# Patient Record
Sex: Female | Born: 1945 | Race: White | Hispanic: No | Smoking: Never smoker
Health system: Southern US, Community
[De-identification: ages and names within clinical notes are randomized; demographics above are authoritative.]

## PROBLEM LIST (undated history)

## (undated) DIAGNOSIS — J45909 Unspecified asthma, uncomplicated: Secondary | ICD-10-CM

## (undated) DIAGNOSIS — G20 Parkinson's disease: Secondary | ICD-10-CM

## (undated) DIAGNOSIS — K7400 Hepatic fibrosis, unspecified: Secondary | ICD-10-CM

## (undated) DIAGNOSIS — E7800 Pure hypercholesterolemia, unspecified: Secondary | ICD-10-CM

## (undated) DIAGNOSIS — J449 Chronic obstructive pulmonary disease, unspecified: Secondary | ICD-10-CM

## (undated) DIAGNOSIS — I10 Essential (primary) hypertension: Secondary | ICD-10-CM

## (undated) DIAGNOSIS — K219 Gastro-esophageal reflux disease without esophagitis: Secondary | ICD-10-CM

## (undated) HISTORY — DX: Parkinson's disease: G20

## (undated) HISTORY — DX: Essential (primary) hypertension: I10

## (undated) HISTORY — DX: Pure hypercholesterolemia, unspecified: E78.00

## (undated) HISTORY — PX: ABDOMINAL HYSTERECTOMY: SHX81

## (undated) HISTORY — DX: Chronic obstructive pulmonary disease, unspecified: J44.9

## (undated) HISTORY — PX: TONSILLECTOMY: SUR1361

## (undated) HISTORY — DX: Gastro-esophageal reflux disease without esophagitis: K21.9

## (undated) HISTORY — PX: LEG SURGERY: SHX1003

## (undated) HISTORY — DX: Hepatic fibrosis: K74.0

## (undated) HISTORY — PX: WRIST SURGERY: SHX841

## (undated) HISTORY — DX: Hepatic fibrosis, unspecified: K74.00

## (undated) HISTORY — PX: LAPAROSCOPY: SHX197

## (undated) HISTORY — DX: Unspecified asthma, uncomplicated: J45.909

## (undated) HISTORY — PX: APPENDECTOMY: SHX54

---

## 1998-11-30 ENCOUNTER — Inpatient Hospital Stay (HOSPITAL_COMMUNITY): Admission: EM | Admit: 1998-11-30 | Discharge: 1998-12-02 | Payer: Self-pay | Admitting: Emergency Medicine

## 1998-11-30 ENCOUNTER — Encounter: Payer: Self-pay | Admitting: Cardiovascular Disease

## 1999-08-02 ENCOUNTER — Encounter: Payer: Self-pay | Admitting: Obstetrics and Gynecology

## 1999-08-10 ENCOUNTER — Inpatient Hospital Stay (HOSPITAL_COMMUNITY): Admission: RE | Admit: 1999-08-10 | Discharge: 1999-08-12 | Payer: Self-pay | Admitting: Obstetrics and Gynecology

## 1999-11-07 ENCOUNTER — Encounter: Payer: Self-pay | Admitting: Neurosurgery

## 1999-11-07 ENCOUNTER — Encounter: Admission: RE | Admit: 1999-11-07 | Discharge: 1999-11-07 | Payer: Self-pay | Admitting: Neurosurgery

## 1999-11-16 ENCOUNTER — Ambulatory Visit (HOSPITAL_COMMUNITY): Admission: RE | Admit: 1999-11-16 | Discharge: 1999-11-16 | Payer: Self-pay | Admitting: Neurosurgery

## 1999-11-16 ENCOUNTER — Encounter: Payer: Self-pay | Admitting: Neurosurgery

## 1999-11-20 ENCOUNTER — Encounter: Admission: RE | Admit: 1999-11-20 | Discharge: 2000-02-18 | Payer: Self-pay | Admitting: Orthopedic Surgery

## 1999-12-11 ENCOUNTER — Encounter: Payer: Self-pay | Admitting: Neurosurgery

## 1999-12-11 ENCOUNTER — Ambulatory Visit (HOSPITAL_COMMUNITY): Admission: RE | Admit: 1999-12-11 | Discharge: 1999-12-11 | Payer: Self-pay | Admitting: Neurosurgery

## 2001-05-09 ENCOUNTER — Ambulatory Visit (HOSPITAL_COMMUNITY): Admission: RE | Admit: 2001-05-09 | Discharge: 2001-05-09 | Payer: Self-pay | Admitting: Family Medicine

## 2001-05-09 ENCOUNTER — Encounter: Payer: Self-pay | Admitting: Family Medicine

## 2001-05-22 ENCOUNTER — Encounter: Payer: Self-pay | Admitting: Family Medicine

## 2001-05-22 ENCOUNTER — Ambulatory Visit (HOSPITAL_COMMUNITY): Admission: RE | Admit: 2001-05-22 | Discharge: 2001-05-22 | Payer: Self-pay | Admitting: Family Medicine

## 2014-12-02 ENCOUNTER — Encounter: Payer: Self-pay | Admitting: Neurology

## 2014-12-02 ENCOUNTER — Ambulatory Visit (INDEPENDENT_AMBULATORY_CARE_PROVIDER_SITE_OTHER): Payer: Medicare Other | Admitting: Neurology

## 2014-12-02 ENCOUNTER — Telehealth: Payer: Self-pay | Admitting: Neurology

## 2014-12-02 VITALS — BP 110/62 | HR 76 | Ht 64.5 in | Wt 180.0 lb

## 2014-12-02 DIAGNOSIS — H539 Unspecified visual disturbance: Secondary | ICD-10-CM

## 2014-12-02 DIAGNOSIS — G4752 REM sleep behavior disorder: Secondary | ICD-10-CM | POA: Diagnosis not present

## 2014-12-02 DIAGNOSIS — K5901 Slow transit constipation: Secondary | ICD-10-CM | POA: Diagnosis not present

## 2014-12-02 DIAGNOSIS — G20 Parkinson's disease: Secondary | ICD-10-CM | POA: Diagnosis not present

## 2014-12-02 MED ORDER — PRAMIPEXOLE DIHYDROCHLORIDE 0.5 MG PO TABS: 0.5000 mg | ORAL_TABLET | Freq: Three times a day (TID) | ORAL | Status: DC

## 2014-12-02 MED ORDER — PRAMIPEXOLE DIHYDROCHLORIDE 0.125 MG PO TABS: ORAL_TABLET | ORAL | Status: DC

## 2014-12-02 NOTE — Progress Notes (Signed)
Note routed to Dr Sunnie Nielsenhakosha Coleman at [phone].

## 2014-12-02 NOTE — Progress Notes (Signed)
Sandra Strickland was seen today in the movement disorders clinic for neurologic consultation at the request of Dr. Effie Shy (she goes to the doctor in [facility] because she is native [place] and it was free and she had no insurance for a while).  The patient has previously seen Dr. Craige Cotta, but those notes are not available.  The consultation is for the evaluation of Parkinson's disease.  The first symptom(s) the patient noticed was shuffling gait and this was 3 years ago and her mother told her to go to the doctor as her father had PD and had similar sx's.  She was then referred to Dr. Craige Cotta.  She had and MRI brain and she cannot remember the results but remembers it wasn't completely normal.  She was dx with PD.  She was placed on a new drug (? Azilect) and she had no insurance and so she was then placed on carbidopa/levodopa 25/100 and she started at 1 po tid and it helped; about 7 months ago, Dr. Craige Cotta increased her medication to 1.5 tid and it helped but she is having more tremor and more shuffling again.  Her biggest c/o is spasms of the hands and cramping of the feet, more at night but also during the daytime.  She will try mustard and vinegar to try and help.  Specific Symptoms:  Tremor: Yes.   (both hands and both legs - not sure that it was ever one sided) Voice: some hypophonic Sleep: trouble staying asleep  Vivid Dreams:  Yes.    Acting out dreams:  Yes.   (sleep walk 3 times, which was very unusual for her) Wet Pillows: No. Postural symptoms:  Yes.    Falls?  Yes.   (fell 2 times over last 2 years; attributes some to back pain and pinched nerve that comes down the left leg) Bradykinesia symptoms: shuffling gait, drooling while awake and difficulty getting out of a chair Loss of smell:  No. Loss of taste:  Yes.   Urinary Incontinence:  No. (better after bladder suspension) Difficulty Swallowing:  Yes.   (big pills usually; rice) Handwriting, micrographia: intermittently, has to consciously  think about it Trouble with ADL's:  No.., but some recent trouble with makeup  Trouble buttoning clothing: Yes.   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  visual distortions: No. N/V:  No. (not consistent but some intermittent) Lightheaded:  intermittent  Syncope: No. Diplopia:  No. Dyskinesia:  Unsure; ? If just having muscle spasms as seems to be moving because of back pain Constipation: no  Neuroimaging has previously been performed.  It is not available for my review today.    ALLERGIES:   Allergies  Allergen Reactions  . Prednisone Other (See Comments)    Elevated BP    CURRENT MEDICATIONS:  Outpatient Encounter Prescriptions as of 12/02/2014  Medication Sig  . albuterol (PROVENTIL) (2.5 MG/3ML) 0.083% nebulizer solution 2.5 mg.  . aspirin 81 MG tablet Take 81 mg by mouth daily.  Marland Kitchen atorvastatin (LIPITOR) 40 MG tablet Take 40 mg by mouth.  . Calcium Carbonate-Vitamin D (CALCIUM-VITAMIN D) 500-200 MG-UNIT per tablet Take 1 tablet by mouth.  . carbidopa-levodopa (SINEMET IR) 25-100 MG per tablet Take 1.5 tablets by mouth 3 (three) times daily.   Marland Kitchen  cyclobenzaprine (FLEXERIL) 10 MG tablet Take 10 mg by mouth.  . flunisolide (NASALIDE) 25 MCG/ACT (0.025%) SOLN Place 2 sprays into the nose 2 (two) times daily.  Marland Kitchen. glycopyrrolate (ROBINUL) 1 MG tablet Take 1 mg by mouth 2 (two) times daily.   Marland Kitchen. lisinopril (PRINIVIL,ZESTRIL) 10 MG tablet Take 10 mg by mouth.  . Magnesium Hydroxide 2400 MG/10ML SUSP Take by mouth.  . metoprolol tartrate (LOPRESSOR) 25 MG tablet Take 25 mg by mouth daily.  . montelukast (SINGULAIR) 10 MG tablet Take 10 mg by mouth.  Marland Kitchen. omeprazole (PRILOSEC) 20 MG capsule Take 20 mg by mouth.  . pramipexole (MIRAPEX) 0.125 MG tablet 1 tablet three times per  day for a week, then 2 tablets three times per day for a week and then fill the 0.5 mg tablet and take that  . pramipexole (MIRAPEX) 0.5 MG tablet Take 1 tablet (0.5 mg total) by mouth 3 (three) times daily.    PAST MEDICAL HISTORY:   Past Medical History  Diagnosis Date  . Parkinson's disease   . Hypercholesteremia   . Hypertension   . GERD (gastroesophageal reflux disease)   . Asthma   . COPD (chronic obstructive pulmonary disease)   . Fibrosis of liver     PAST SURGICAL HISTORY:   Past Surgical History  Procedure Laterality Date  . Tonsillectomy    . Appendectomy    . Abdominal hysterectomy    . Leg surgery Bilateral     2 left 4 right  . Laparoscopy    . Wrist surgery Left     SOCIAL HISTORY:   History   Social History  . Marital Status: Legally Separated    Spouse Name: N/A  . Number of Children: N/A  . Years of Education: N/A   Occupational History  . Not on file.   Social History Main Topics  . Smoking status: Never Smoker   . Smokeless tobacco: Not on file  . Alcohol Use: No  . Drug Use: No  . Sexual Activity: Not on file   Other Topics Concern  . Not on file   Social History Narrative  . No narrative on file    FAMILY HISTORY:   Family Status  Relation Status Death Age  . Mother Alive     heart disease  . Father Deceased     PD  . Brother Deceased     Liver, lung, esophageal cancer  . Sister Alive     HTN  . Son Alive     healthy  . Daughter Alive     healthy  . Daughter Alive     healthy    ROS:  Having issues with peripheral vision off to the right for years.  ]A complete 10 system review of systems was obtained and was unremarkable apart from what is mentioned above.  PHYSICAL EXAMINATION:    VITALS:   Filed Vitals:   12/02/14 0927  BP: 110/62  Pulse: 76  Height: 5' 4.5" (1.638 m)  Weight: 180 lb (81.647 kg)    GEN:  The patient appears stated age and is in NAD. HEENT:  Normocephalic, atraumatic.  The mucous membranes  are moist. The superficial temporal arteries are without ropiness or tenderness. CV:  RRR Lungs:  CTAB Neck/HEME:  There are no carotid bruits bilaterally.  Neurological examination:  Orientation: The patient is alert and oriented x3. Fund of knowledge is appropriate.  Recent and remote memory are intact.  Attention and concentration are normal.  Able to name objects and repeat phrases. Cranial nerves: There is good facial symmetry.  There is just mild facial hypomimia Pupils are equal round and reactive to light bilaterally. Fundoscopic exam reveals clear margins bilaterally.  There are no square wave jerks.  Extraocular muscles are intact. The visual fields are full to confrontational testing.  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  The speech is fluent and clear.  She is mildly hypophonic.  She has no difficulty with the guttural sounds.  Soft palate rises symmetrically and there is no tongue deviation. Hearing is intact to conversational tone. Sensation: Sensation is intact to light and pinprick throughout (facial, trunk, extremities). Vibration is intact at the bilateral big toe. There is no extinction with double simultaneous stimulation. There is no sensory dermatomal level identified. Motor: Strength is 5/5 in the bilateral upper and lower extremities.   Shoulder shrug is equal and symmetric.  There is no pronator drift. Deep tendon reflexes: Deep tendon reflexes are 2/4 at the bilateral biceps, triceps, brachioradialis, patella and 1/4 at the bilateral achilles. Plantar responses are downgoing bilaterally.  Movement examination: Tone: There is normal tone in the bilateral upper extremities.  The tone in the lower extremities is normal.  Abnormal movements: No tremor identified today, even with distraction procedures Coordination:  There is mild decremation with RAM's, seen most prominently with finger  taps bilaterally Gait and Station: The patient has no significant difficulty arising out of a deep-seated chair without the use of the hands. The patient's stride length is slightly decreased, but she also has an antalgic gait and drags the left leg.  She has some decreased arm swing on the right.  I tried to pull test several times and repeated instructions, but each time she grabbed onto the wall.    ASSESSMENT/PLAN:  1.  Parkinson's disease, by history  -She really did not look particularly parkinsonian today, but she had taken medication but not since 3:30 AM and she was in my office at approximately 10:30 AM.  I asked her to hold her medications next visit before she comes in.  She does have symptoms that sound consistent with Parkinson's disease, although she is very well right up on the disease.  She has a Parkinson's advocate as well as an is part of the [employer].  I spent much greater than 50% of the visit in counseling with her today.  She is having significant cramping, which could be because she is spacing the medication out so much.  She gets up at 3:30 AM because of her job and ends up taking her carbidopa levodopa at 3:30 AM/11:30 AM/5:30 PM and then doesn't go to bed until somewhere between 9 PM and 11 PM.  It was resistant to increasing her levodopa, primarily because of how good she looked today and because it does not sound like she has tried an agonist.  -We decided to go ahead and try Mirapex and hopefully this helps with the cramping that she is having.  She will work up to 0.5 g 3 times per day.  She does have insurance now.  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  The opportunity to ask questions was given and they were answered to the best of my  ability.  The patient expressed understanding and  willingness to follow the outlined treatment protocols.  -We talked about community resources.  She lives quite a ways away, but states she does not mind driving for LSVT/PWR trained providers.  Therefore, she will be referred to the neuro rehabilitation center.  We talked about the [facility] biking program.  We talked about the importance of safe, cardiovascular exercise.  We talked about the upcoming Yisroel Ramming challenge and she was given patient education/information on this.  Talked about the mPower app. 2.  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  I was unable to reproduce this on examination.  She states that she has had an MRI.  I would like to try to get a copy of that.  I would like to refer her to ophthalmology.  She did not think she could do that, as did not think Medicare would pay.  I think that they would and I will try to get her a referral. 3.  Constipation  -A copy of the rancho recipe was given. 4.  REM behavior disorder  -I did not want to change too many things at once.  She is only had a few episodes, so we will keep an eye on this and consider clonazepam in the future. 5.  I will try to get a copy of her prior neurologist's records, Dr. Cra[facility].  Follow-up with me will be in the next few months, sooner should new neurologic issues arise.

## 2014-12-02 NOTE — Telephone Encounter (Signed)
[facility] with notes faxed to [phone] with confirmation received.

## 2014-12-02 NOTE — Patient Instructions (Addendum)
1. Constipation and Parkinson's disease:   1.Rancho recipe for constipation in Parkinsons Disease:  -1 cup of bran, 2 cups of applesauce in 1 cup of prune juice  2.  Increase fiber intake (Metamucil,vegetables)  3.  Regular, moderate exercise can be beneficial.  4.  Avoid medications causing constipation, such as medications like antacids with calcium or magnesium  5.  Laxative overuse should be avoided.  6.  Stool softeners (Colace) can help with chronic constipation. 2. Start mirapex (pramipexole) as follows:  0.125 mg - 1 tablet three times per day for a week, then 2 tablets three times per day for a week and then fill the 0.5 mg tablet and take that, 1 pill three times per day 3. MPower is the app that records results for Parkinson's Disease research.  4. [employer] Challenge: 11th annual event, Saturday April 23rd, 2016.   25 mile bike ride:    adventurous moderate distance for intermediate riders   50 mile bike ride:   challenging and scenic ride to [place],  plenty of climbing for experienced riders!   10K run:  safe route through a local neighborhood   5K run/walk:  safe route through a local neighborhood  [employer] scenery... Marked routes... Food... Drink... Great rest stop... Sag wagon support!   No "Entry Fee", donations expected & appreciated 100% of all donations will go to PD/PSP research, and Local Families faced with these challenges. Check In Time Opens: 7:30 a.m. Start Times: 50 mile ride 8:30 a.m.  25 mile ride 9:30 a.m.  5K & 10K 10 a.m. Helmets required All participants must sign a waiver of liability on the day of the  event at registration. Cookout following the event If you can't join us please consider a tax deductible donation  Make checks payable to: [employer]                                      [id number]                                     [place], [place] [id number]  5. We have scheduled [facility] at [facility] for your  eye exam on 12/17/2014 at 2:45 pm. Please arrive 15 minutes prior and go to [address] [place] [id number]. If you need to reschedule for any reason please call [phone]. 6. You have been referred to Neuro Rehab. They will call you directly to schedule an appointment.  Please call [phone] if you do not hear from them.  7. Next appt stay off Parkinson's medications on the day of appt. Follow up in 3 months.

## 2014-12-06 ENCOUNTER — Telehealth: Payer: Self-pay | Admitting: Neurology

## 2014-12-06 NOTE — Telephone Encounter (Signed)
Called patient back and she has the number. She is not able to do the bike program at [facility] but she is buying a stationary bike to use at home and wanted to let Dr Tat know.

## 2014-12-06 NOTE — Telephone Encounter (Signed)
Pt needs the telephone number to the where she is to have speech done at please call [phone]

## 2014-12-29 ENCOUNTER — Ambulatory Visit: Payer: Medicare Other | Admitting: Physical Therapy

## 2014-12-29 ENCOUNTER — Ambulatory Visit: Payer: Medicare Other | Admitting: Occupational Therapy

## 2014-12-29 ENCOUNTER — Ambulatory Visit: Payer: Medicare Other | Attending: Neurology

## 2014-12-29 DIAGNOSIS — R4189 Other symptoms and signs involving cognitive functions and awareness: Secondary | ICD-10-CM | POA: Insufficient documentation

## 2014-12-29 DIAGNOSIS — M6281 Muscle weakness (generalized): Secondary | ICD-10-CM | POA: Insufficient documentation

## 2014-12-29 DIAGNOSIS — R269 Unspecified abnormalities of gait and mobility: Secondary | ICD-10-CM | POA: Diagnosis not present

## 2014-12-29 DIAGNOSIS — R490 Dysphonia: Secondary | ICD-10-CM | POA: Insufficient documentation

## 2014-12-29 DIAGNOSIS — R258 Other abnormal involuntary movements: Secondary | ICD-10-CM

## 2014-12-29 DIAGNOSIS — R279 Unspecified lack of coordination: Secondary | ICD-10-CM | POA: Diagnosis not present

## 2014-12-29 NOTE — Patient Instructions (Signed)
Read out loud for approx 5 minutes and use your stronger talking

## 2014-12-29 NOTE — Therapy (Signed)
[facility] [address] [place], [place], [address] Phone: [phone]   Fax:  [phone]  Occupational Therapy Evaluation  Patient Details  Name: Sandra Strickland MRN: [id number] Date of Birth: 1946-02-20 Referring Provider:  No ref. provider found  Encounter Date: 12/29/2014      OT End of Session - 12/29/14 0858    Visit Number 1   Number of Visits 17   Date for OT Re-Evaluation 02/26/15   Authorization Type Medicare   Authorization - Visit Number 1   Authorization - Number of Visits 10   OT Start Time [phone]   OT Stop Time 0930   OT Time Calculation (min) 39 min   Activity Tolerance Patient tolerated treatment well   Behavior During Therapy [facility] for tasks assessed/performed      Past Medical History  Diagnosis Date  . Parkinson's disease   . Hypercholesteremia   . Hypertension   . GERD (gastroesophageal reflux disease)   . Asthma   . COPD (chronic obstructive pulmonary disease)   . Fibrosis of liver     Past Surgical History  Procedure Laterality Date  . Tonsillectomy    . Appendectomy    . Abdominal hysterectomy    . Leg surgery Bilateral     2 left 4 right  . Laparoscopy    . Wrist surgery Left     There were no vitals filed for this visit.  Visit Diagnosis:  Lack of coordination  Bradykinesia  Cognitive deficits      Subjective Assessment - 12/29/14 0855    Subjective  Pt reports she was diagnosed with PD approximately 3 years ago and has had a decline in status.   Pertinent History CVA , ? homonymous hemianopsia per MD notes, PD   Patient Stated Goals HEP, improve handwriting, ADLS   Currently in Pain? Yes   Pain Score 10-Worst pain ever   Pain Location Wrist   Pain Orientation Right   Pain Descriptors / Indicators Aching   Pain Type Chronic pain   Pain Onset More than a month ago   Pain Frequency Intermittent   Aggravating Factors  weather, arthritis   Pain Relieving Factors  meds, advil   Multiple Pain Sites No           [facility] OT Assessment - 12/29/14 0001    Assessment   Diagnosis Parkinson's disease   Onset Date --  diagnosed approximately 2013   Assessment Pt with Parkinson's disease reports recent decline in status and she changed physician's to Dr. Arbutus Leas.   Prior Therapy PT   Precautions   Precautions Fall   Precaution Comments Pt reported visual deficit(? R homonymous hemianopsia)  from old CVA  Pt sees opthamologist today   Balance Screen   Has the patient fallen in the past 6 months Yes   How many times? 2   Has the patient had a decrease in activity level because of a fear of falling?  No   Is the patient reluctant to leave their home because of a fear of falling?  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   Mobility   Mobility Status Independent   Written Expression   Dominant Hand Right   Handwriting 100% legible;Mild micrographia  trails off at end   Vision - History   Visual History Other (comment)   Patient Visual Report Peripheral vision impairment   Additional Comments --  wears reading glasses only.   Vision Assessment    Vision Assessment Vision impaired  _ to be further tested in functional context   Comment Pt is seeing opthamologist regarding vision today, therapist requests copy of report.   Cognition   Memory Impaired  per pt report    Observation/Other Assessments   Standing Functional Reach Test RUE 8.5 in, LUE 9 in with LOB   Sensation   Light Touch Appears Intact   Coordination   Gross Motor Movements are Fluid and Coordinated Yes   Fine Motor Movements are Fluid and Coordinated No   9 Hole Peg Test Right;Left   Right 9 Hole Peg Test 21.75   Left 9 Hole Peg Test 23.59    Tremors bilateral UE's with fatigue per pt report, not observed   Coordination mildy decreased   ROM / Strength   AROM / PROM / Strength AROM   AROM   Overall AROM  Deficits   Overall AROM Comments grossly WFLS except for decreased bilateral wrist extension                           OT Short Term Goals - 12/29/14 1131    OT SHORT TERM GOAL #1   Title I with initial HEP.   Baseline check 01/27/15   Time 4   Period Weeks   Status New   OT SHORT TERM GOAL #2   Title Pt will verbalize understanding of adapted strategies for ADLS/IADLS.   Time 4   Period Weeks   Status New   OT SHORT TERM GOAL #3   Title Pt will demonstrate increased ease with feeding as evidenced by performing PPT#2 in 10 secs or less.   Time 4   Period Weeks   Status New   OT SHORT TERM GOAL #4   Title Pt will demonstrate ability to write short paragraph with 100% legibility and no significant decrease in letter size.   Time 4   Period Weeks   Status New           OT Long Term Goals - 12/29/14 1133    OT LONG TERM GOAL #1   Title Pt will verbalize understanding of ways to prevent future PD- releated complications and verbalize understanding of community resources.   Baseline 02/26/15   Time 8   Period Weeks   Status New   OT LONG TERM GOAL #2   Title Pt will demontrate improved functional reaching as evidenced by  improving bilateral standing functional reach to 10 inches or greater bilaterally, without LOB   Baseline baseline: R: 8.5, L 9 inches with 1 LOB   Time 8   Period Weeks   Status New   OT LONG TERM GOAL #3  Title Pt will verbalize understanding of compensatory strategies for cognitive deficits and ways to keep thinking skills sharp.   Time 8   Period Weeks   Status New   OT LONG TERM GOAL #4   Title Pt will verbalize understanding of compensatory strategies for visual deficits.   Time 8   Period Weeks   Status New               Plan - 12/29/14 1128    Clinical Impression Statement Pt diagnosed with PD several years ago, with history of CVA presents with mildly decreased coordination, bradykinesia, and cognitive deficits which impedes ADLs/ IADLS.   Pt will benefit from skilled therapeutic intervention in order to improve on the following deficits (Retired) Decreased cognition;Impaired flexibility;Pain;Impaired vision/preception;Impaired sensation;Decreased coordination;Decreased activity tolerance;Decreased endurance;Decreased range of motion;Decreased strength;Impaired UE functional use;Impaired perceived functional ability;Difficulty walking;Decreased balance   Rehab Potential Good   Clinical Impairments Affecting Rehab Potential see above   OT Frequency 1x / week  plus eval, per pt request   OT Duration 8 weeks   Plan PWR! moves HEP, handwriting strategies   Consulted and Agree with Plan of Care Patient          G-Codes - 12/29/14 1254    Functional Assessment Tool Used standing functional reach RUE 8.5 inches, LUE 9 inches with LOB, PPT #2: 12.0 secs, mild micrographia,    Functional Limitation Self care   Self Care Current Status ([id number]) At least 20 percent but less than 40 percent impaired, limited or restricted   Self Care Goal Status (U0454(G8988) At least 1 percent but less than 20 percent impaired, limited or restricted      Problem List There are no active  problems to display for this patient.   RINE,KATHRYN 12/29/2014, 1:00 PM Keene BreathKathryn Rine, OTR/L [phone] Phone: [phone] 1:00 PM 12/29/2014 [facility] [address] [place], [place], [address] Phone: [phone]   Fax:  [phone]

## 2014-12-29 NOTE — Therapy (Addendum)
[facility] [address] [place], [place], [address] Phone: [phone]   Fax:  [phone]  Speech Language Pathology Evaluation  Patient Details  Name: Sandra Strickland MRN: [id number] Date of Birth: 1945/11/13 Referring Provider:  No ref. provider found  Encounter Date: 12/29/2014      End of Session - 12/29/14 1334    Visit Number 1   Number of Visits 17   Date for SLP Re-Evaluation 02/27/15   SLP Start Time 1019   SLP Stop Time  1101   SLP Time Calculation (min) 42 min   Activity Tolerance Patient tolerated treatment well      Past Medical History  Diagnosis Date  . Parkinson's disease   . Hypercholesteremia   . Hypertension   . GERD (gastroesophageal reflux disease)   . Asthma   . COPD (chronic obstructive pulmonary disease)   . Fibrosis of liver     Past Surgical History  Procedure Laterality Date  . Tonsillectomy    . Appendectomy    . Abdominal hysterectomy    . Leg surgery Bilateral     2 left 4 right  . Laparoscopy    . Wrist surgery Left     There were no vitals filed for this visit.  Visit Diagnosis: Hypokinetic Parkinsonian dysphonia      Subjective Assessment - 12/29/14 1035    Subjective "I saw what my dad went through - he had Parkinson's - and I try to talk louder because he lost his voice."            SLP Evaluation [facility] - 12/29/14 1036    Pain Assessment   Currently in Pain? Yes   Pain Score 10-Worst pain ever   Pain Location Wrist   Pain Orientation Right   Pain Type Chronic pain   Pain Onset More than a month ago   Pain Frequency Intermittent   Pain Relieving Factors meds      7 minutes of conversational speech was reduced today, at average 68dB (WNL= average 70-72dB) with range of 62-71dB, when a sound level meter was placed 30 cm away from pt's mouth. Overall intelligibility for this listener in a quiet environment was approx 95-100%. Production of loud /a/  averaged 84dB (range of 79 to 85dB) and rare verbal cues needed for loudness. Pt rated effort level at 6/10 for production of loud /a/ (10=maximal effort).   Oral motor assessment revealed incoordinated lingual ROM and WFL lingual strength. Labial ROM was incoordinated and strength was [facility]. Velar ROM appeared WFL/WNL.   After evaluation tasks, pt was asked to use the same amount of effort as with loud /a/ in a 6 minute conversation task. SLP assessed pt's success during this conversation at remaining within the expected loudness range of 70-72dB. Loudness average with this increased effort was 71dB (range of 66 to 74) with occasional SLP verbal A for loudness. Pt would benefit from skilled ST in order to improve speech intelligibility and pt's QOL.          SLP Education - 12/29/14 1055    Education provided Yes   Education Details loud /a/ (shaping and need for daily), need for louder speech   Person(s) Educated Patient   Methods Explanation   Comprehension Verbalized understanding;Returned demonstration;Verbal cues required          SLP Short Term Goals - 12/29/14 1337    SLP SHORT TERM GOAL #1   Title pt will maintain loud /a/ of  85dB over 3 sessions   Time 4   Period Weeks   Status New   SLP SHORT TERM GOAL #2   Title pt will tell sentence responses with average 71dB over two sessions   Time 4   Period Weeks   Status New   SLP SHORT TERM GOAL #3   Title pt will maintain average 71dB over two sessions in 5 minute conversation   Time 4   Period Weeks   Status New          SLP Long Term Goals - 12/29/14 1340    SLP LONG TERM GOAL #1   Title pt will maintain 85dB over 5 sessions with loud /a/   Time 8   Period Weeks   Status New   SLP LONG TERM GOAL #2   Title pt will participate in 10 minutes mod complex conversation with average 71dB   Time 8   Period Weeks   Status New          Plan - 12/29/14 1335    Clinical Impression Statement Pt presents with mild  dysarthria today characterized by decr'd breath support.   Speech Therapy Frequency 1x /week  due to travel distance   Duration --  8 weeks   Treatment/Interventions Compensatory strategies;SLP instruction and feedback;Patient/family education;Functional tasks;Cueing hierarchy   Potential to Achieve Goals Good          G-Codes - 12/29/14 1342    Functional Assessment Tool Used noms, clincial observation   Functional Limitations Motor speech   Motor Speech Current Status [phone]([id number]) At least 1 percent but less than 20 percent impaired, limited or restricted   Motor Speech Goal Status ([id number](G9186) 0 percent impaired, limited or restricted      Problem List There are no active problems to display for this patient.   [facility] Center[facility]INKE,Tregan Read , MS, CCC-SLP  12/29/2014, 1:42 PM  [place] [facility] [address] [place], [place], [address] Phone: [phone]   Fax:  [phone]   ADDENDUM:  01-04-15 1652 Pt called and cancelled all appointments due to not being able to get off of work to make appointments. Therefore pt will not be receiving ST.  Verdie Mosherarl Brianca Fortenberry, MS, CCC-SLP

## 2014-12-29 NOTE — Therapy (Signed)
[facility] [address] [place], [place], [address] Phone: [phone]   Fax:  [phone]  Physical Therapy Evaluation  Patient Details  Name: Sandra Strickland MRN: [id number] Date of Birth: 1946-06-23 Referring Provider:  No ref. provider found  Encounter Date: 12/29/2014      PT End of Session - 12/29/14 1813    Visit Number 1   Number of Visits 9   Date for PT Re-Evaluation 02/27/15   Authorization Type Medicare; requires G-code every 10th visit   PT Start Time 0935   PT Stop Time 1015   PT Time Calculation (min) 40 min   Activity Tolerance Patient tolerated treatment well   Behavior During Therapy [facility] for tasks assessed/performed      Past Medical History  Diagnosis Date  . Parkinson's disease   . Hypercholesteremia   . Hypertension   . GERD (gastroesophageal reflux disease)   . Asthma   . COPD (chronic obstructive pulmonary disease)   . Fibrosis of liver     Past Surgical History  Procedure Laterality Date  . Tonsillectomy    . Appendectomy    . Abdominal hysterectomy    . Leg surgery Bilateral     2 left 4 right  . Laparoscopy    . Wrist surgery Left     There were no vitals filed for this visit.  Visit Diagnosis:  Bradykinesia  Abnormality of gait  Muscle weakness of lower extremity      Subjective Assessment - 12/29/14 0938    Subjective Pt is a 69 year old female who presents to OP PT with Parkinson's disease.  She reports diagnosis was about 3 years ago.  She feels medications help with the tremors.  She notices her feet are shuffling with gait, more so than usual.  Pt has had 3 falls.  2 falls have occurred on ice; most recent fall was in the bathroom, where she just went down.   Pertinent History History of fibromyalgia, asthma   Patient Stated Goals Pt's goal for therapy is to be able to move easier.   Currently in Pain? Yes   Pain Score 10-Worst pain ever   Pain Location  Wrist  OT has documented wrist pain   Pain Orientation Right   Pain Descriptors / Indicators Aching   Pain Type Chronic pain   Pain Onset More than a month ago   Pain Frequency Intermittent   Aggravating Factors  weather   Pain Relieving Factors meds, advil            [facility] PT Assessment - 12/29/14 0945    Assessment   Medical Diagnosis Parkinson's disease   Precautions   Precautions Fall   Balance Screen   Has the patient fallen in the past 6 months Yes   How many times? 3   Has the patient had a decrease in activity level because of a fear of falling?  Yes   Is the patient reluctant to leave their home because of a fear of falling?  No   Home Environment   Living Enviornment Private residence   Living Arrangements Spouse/significant other   Available Help at Discharge Family  Husband   Type of Home House   Home Access Stairs to enter  platforms on deck to enter back of home; 1 step at a time   Entrance Stairs-Number of Steps 1   Home Layout One level;Able to live on main level with bedroom/bathroom   Home Equipment  Gilmer MorCane - single point   Prior Function   Vocation Full time employment   Vocation Requirements bus driver [employer]   Observation/Other Assessments   Focus on Therapeutic Outcomes (FOTO)  Functional Status Intake score 54; neuro QOL 36.9   Posture/Postural Control   Posture Comments Pt stands with RLE flexed at knee due to LLE being shorter than RLE.  Pt reports trying various height lifts in the past, with no improvement in gait.   ROM / Strength   AROM / PROM / Strength Strength   Strength   Strength Assessment Site Hip;Knee;Ankle   Right/Left Hip Right;Left   Right Hip Flexion 3+/5   Left Hip Flexion 4/5   Right/Left Knee Right;Left   Right Knee Flexion 4/5   Right Knee Extension 3+/5   Left Knee Flexion 4/5   Left Knee Extension 3+/5   Right/Left Ankle Right;Left   Right Ankle Dorsiflexion 4/5   Left Ankle Dorsiflexion 4/5    Transfers   Transfers Sit to Stand;Stand to Sit   Sit to Stand 6: Modified independent (Device/Increase time);Without upper extremity assist;From chair/3-in-1;Five times sit to stand  5x sit<>stand:  10.98 sec   Stand to Sit 6: Modified independent (Device/Increase time);Without upper extremity assist;To chair/3-in-1  Reports difficulty/help needed from lower surfaces   Ambulation/Gait   Ambulation/Gait Yes   Ambulation/Gait Assistance 6: Modified independent (Device/Increase time)   Ambulation Distance (Feet) 200 Feet   Assistive device Straight cane   Gait Pattern Step-through pattern;Decreased step length - left;Decreased stance time - left;Decreased step length - right;Antalgic;Poor foot clearance - left;Poor foot clearance - right  lateral lean due to LLE shorter than RLE   Ambulation Surface Level;Indoor   Gait velocity 15.86=2.07 ft/sec   Stairs Yes   Stairs Assistance 6: Modified independent (Device/Increase time)   Stair Management Technique Two rails;Alternating pattern   Number of Stairs 4   Height of Stairs 6   Standardized Balance Assessment   Standardized Balance Assessment Timed Up and Go Test;Four Square Step Test   Timed Up and Go Test   TUG Normal TUG;Manual TUG;Cognitive TUG   Normal TUG (seconds) 14.47   Manual TUG (seconds) 14.25   Cognitive TUG (seconds) 17.9   Functional Gait  Assessment   Gait assessed  Yes   Gait Level Surface Walks 20 ft, slow speed, abnormal gait pattern, evidence for imbalance or deviates 10-15 in outside of the 12 in walkway width. Requires more than 7 sec to ambulate 20 ft.  10.38 sec   Change in Gait Speed Able to change speed, demonstrates mild gait deviations, deviates 6-10 in outside of the 12 in walkway width, or no gait deviations, unable to achieve a major change in velocity, or uses a change in velocity, or uses an assistive device.   Gait with Horizontal Head Turns Performs head turns with moderate changes in gait velocity, slows  down, deviates 10-15 in outside 12 in walkway width but recovers, can continue to walk.   Gait with Vertical Head Turns Performs task with moderate change in gait velocity, slows down, deviates 10-15 in outside 12 in walkway width but recovers, can continue to walk.   Gait and Pivot Turn Turns slowly, requires verbal cueing, or requires several small steps to catch balance following turn and stop  extra steps to get balance with turn   Step Over Obstacle Is able to step over one shoe box (4.5 in total height) but must slow down and adjust steps to clear box safely.  May require verbal cueing.   Gait with Narrow Base of Support Ambulates less than 4 steps heel to toe or cannot perform without assistance.   Gait with Eyes Closed Cannot walk 20 ft without assistance, severe gait deviations or imbalance, deviates greater than 15 in outside 12 in walkway width or will not attempt task.   Ambulating Backwards Walks 20 ft, slow speed, abnormal gait pattern, evidence for imbalance, deviates 10-15 in outside 12 in walkway width.  15.60 sec in 10 ft   Steps Alternating feet, must use rail.   Total Score 10                             PT Short Term Goals - 12/29/14 1817    PT SHORT TERM GOAL #1   Title Pt will be independent with HEP for improved strength, balance and gait. (Target date 01/28/15)   Time 4   Period Weeks   Status New   PT SHORT TERM GOAL #2   Title Pt will perform at least 8 of 10 reps of sit<>stand transfers from surfaces <18 inches, without UE support, independently, for improved efficiency and safety with transfers.   Time 4   Period Weeks   Status New   PT SHORT TERM GOAL #3   Title Pt will improve TUG score to less than or equal to 13.5 seconds for decreased fall risk.   Baseline TUG 14.47 at eval   Time 4   Period Weeks   Status New   PT SHORT TERM GOAL #4   Title Pt will improve Functional Gait Assessment to at least 15/30 for decreased fall risk.    Baseline FGA 10/30 at eval   Time 4   Period Weeks   Status New           PT Long Term Goals - 12/29/14 1819    PT LONG TERM GOAL #1   Title Pt will verbalize understanding of fall prevention within the home environment.  Target 02/27/15   Time 8   Period Weeks   Status New   PT LONG TERM GOAL #2   Title Pt will improve gait velocity to at least 2.62 ft/sec for improved gait efficiency and safety.   Baseline gait velocity 2.07 ft/sec   Time 8   Period Weeks   Status New   PT LONG TERM GOAL #3   Title Pt will improve TUG cognitive score to less than or equal to 15 seconds for decreased fall risk and improved dual tasking.   Time 8   Period Weeks   Status New   PT LONG TERM GOAL #4   Title Pt will improve Functional Gait assessment score to at least 20/30 for decreased fall risk.   Time 8   Period Weeks   Status New   PT LONG TERM GOAL #5   Title Pt will verbalize plans for continued community fitness upon D/C from PT.   Time 8   Period Weeks   Status New               Plan - 12/29/14 1813    Clinical Impression Statement Pt is a 69 year old female who presents to OP PT with Parkinson's disease diagnosed about 3 years ago.  She feels she is noticing increased shuffling with gait and had had 3 falls in the past 6-9 months.  She is at fall risk per TUG and Functional Gait  Assessment scores; she has decreased gait speed of 2.07 ft/sec.  Pt presents with bradykinesia, decreased timing and coordination of gait, decreased functional strength.  Pt would benefit from PT to address balance, strength, gait for overall improved functional mobility and decreased fall risk.   Pt will benefit from skilled therapeutic intervention in order to improve on the following deficits Abnormal gait;Decreased balance;Decreased mobility;Difficulty walking;Decreased strength   Rehab Potential Good   PT Frequency 1x / week   PT Duration 8 weeks  plus eval   PT Treatment/Interventions ADLs/Self  Care Home Management;Therapeutic activities;Functional mobility training;Gait training;Therapeutic exercise;Balance training;Neuromuscular re-education;Cognitive remediation;Patient/family education   PT Next Visit Plan Initiate PWR! Moves HEP in sitting and standing (coordinate with OT); sit<>stand transfers from lower surfaces, gait activities   Consulted and Agree with Plan of Care Patient          G-Codes - January 14, 2015 1822    Functional Assessment Tool Used Timed Up and Go:  14.47 sec, TUG cognitive 17.9 sec, gait velocity 2.07 ft/sec; FGA 10/30   Functional Limitation Mobility: Walking and moving around   Mobility: Walking and Moving Around Current Status [phone]) At least 20 percent but less than 40 percent impaired, limited or restricted   Mobility: Walking and Moving Around Goal Status [phone]) At least 1 percent but less than 20 percent impaired, limited or restricted       Problem List There are no active problems to display for this patient.   Alcide Memoli W. 2015/01/14, 6:23 PM  Gean Maidens., PT [facility] [address] [place], [place], [address] Phone: [phone]   Fax:  [phone]

## 2015-01-04 NOTE — Therapy (Signed)
[facility] [address] [place], [place], [address] Phone: [phone]   Fax:  [phone]  Patient Details  Name: Sandra Strickland MRN: [id number] Date of Birth: [birth year] Referring Provider:  No ref. provider found  Encounter Date: 01/04/2015 Pt called to cancel all appointments due to not being able to get off of work.  Discharge at this time from skilled ST. Pt would benefit from therapy in future when her schedule better allows.  [facility]KE,Bakari Nikolai , MS, CCC-SLP  01/04/2015, 4:53 PM  [place] [facility] [address] [place], [place], [address] Phone: [phone]   Fax:  [phone]

## 2015-01-05 ENCOUNTER — Ambulatory Visit: Payer: Medicare Other

## 2015-01-12 ENCOUNTER — Encounter: Payer: PRIVATE HEALTH INSURANCE | Admitting: Occupational Therapy

## 2015-01-19 ENCOUNTER — Encounter: Payer: PRIVATE HEALTH INSURANCE | Admitting: Occupational Therapy

## 2015-01-26 ENCOUNTER — Encounter: Payer: PRIVATE HEALTH INSURANCE | Admitting: Occupational Therapy

## 2015-01-26 ENCOUNTER — Ambulatory Visit: Payer: PRIVATE HEALTH INSURANCE | Admitting: Physical Therapy

## 2015-02-04 ENCOUNTER — Telehealth: Payer: Self-pay | Admitting: Neurology

## 2015-02-04 NOTE — Telephone Encounter (Signed)
See below, please call pt / Sandra S.

## 2015-02-04 NOTE — Telephone Encounter (Signed)
Pt called and said that Dr Tat wanted her to go off her Parkinson's medication but she had another spell and would like a call back [phone]/Dawn

## 2015-02-04 NOTE — Telephone Encounter (Signed)
Unable to speak with patient [facility]

## 2015-02-08 ENCOUNTER — Telehealth: Payer: Self-pay | Admitting: Neurology

## 2015-02-08 NOTE — Telephone Encounter (Signed)
Tried to call patient back no answer and no way to leave message.

## 2015-02-08 NOTE — Telephone Encounter (Signed)
Pt called and would like a call back because she said that Dr Tat did not want her to take her meds on the day of her appointment but she said she has spells when not on it and did not if she should drive or not/Dawn WU#[phone]#[phone]

## 2015-02-09 NOTE — Telephone Encounter (Signed)
Patient made aware she should have a driver.

## 2015-03-02 ENCOUNTER — Encounter: Payer: Self-pay | Admitting: Neurology

## 2015-03-02 ENCOUNTER — Ambulatory Visit (INDEPENDENT_AMBULATORY_CARE_PROVIDER_SITE_OTHER): Payer: Medicare Other | Admitting: Neurology

## 2015-03-02 VITALS — BP 120/76 | HR 64 | Ht 64.0 in | Wt 180.0 lb

## 2015-03-02 DIAGNOSIS — G20A1 Parkinson's disease without dyskinesia, without mention of fluctuations: Secondary | ICD-10-CM

## 2015-03-02 DIAGNOSIS — G4752 REM sleep behavior disorder: Secondary | ICD-10-CM | POA: Diagnosis not present

## 2015-03-02 DIAGNOSIS — G20 Parkinson's disease: Secondary | ICD-10-CM | POA: Diagnosis not present

## 2015-03-02 DIAGNOSIS — R251 Tremor, unspecified: Secondary | ICD-10-CM

## 2015-03-02 NOTE — Progress Notes (Signed)
Sandra Strickland was seen today in the movement disorders clinic for neurologic consultation at the request of Dr. Effie Shy (she goes to the doctor in [facility] because she is native [place] and it was free and she had no insurance for a while).  The patient has previously seen Dr. Craige Cotta, but those notes are not available.  The consultation is for the evaluation of Parkinson's disease.  The first symptom(s) the patient noticed was shuffling gait and this was 3 years ago and her mother told her to go to the doctor as her father had PD and had similar sx's.  She was then referred to Dr. Craige Cotta.  She had and MRI brain and she cannot remember the results but remembers it wasn't completely normal.  She was dx with PD.  She was placed on a new drug (? Azilect) and she had no insurance and so she was then placed on carbidopa/levodopa 25/100 and she started at 1 po tid and it helped; about 7 months ago, Dr. Craige Cotta increased her medication to 1.5 tid and it helped but she is having more tremor and more shuffling again.  Her biggest c/o is spasms of the hands and cramping of the feet, more at night but also during the daytime.  She will try mustard and vinegar to try and help.  03/02/15 update:  The patient is following up today.  Last visit, I added pramipexole, 0.5 mg 3 times a day because of significant cramping.  She also takes carbidopa/levodopa 25/100, 1.5 tablet at 3:30 AM, 1.5 tablet at 11:30 AM and 1.5 tablet at 5:30 PM.  I want her to hold her medication for today's visit as last visit she did not look particularly parkinsonian and wanted to see how she looked today off of medication.  She has been off since 5 pm yesterday.  Since our last visit, she tried to attend therapy at the neuro-rehabilitation center but states that they couldn't do it all in one day and couldn't accomodate her work schedule.  She bought a stationary bike and a punching bag and boxing and feels so much better.  She is swimming for exercise  as well and her husband has noted a big difference in her physicially.  She feels much better.  She is averaging 12,000 steps a day.  The patient did see Dr. Nelle Don at [facility] ophthalmology because of vision change.  He did state that the patient had evidence of early cataracts with a history of stroke and perhaps some visual field loss but he needed to repeat the visual field testing in the future because she did not do well with initial testing.  There were a lot of false negatives.  In the right eye, there appeared to be a nasal defect but the left eye was nonspecific; there did appear to be visual field loss but it was nondiagnostic.  I was able to get records from her prior neurologist at [facility] since last visit.  It looks like they first saw the patient on 06/10/2013 and there was evidence of cogwheel rigidity on the left and it was suggested that the patient start Mirapex.  However, it turns out that this was too expensive for the patient at the time because she did not have insurance.  She was therefore started on levodopa, which did help somewhat.  This was increased in September, 2013-08-13/2 tablets 3 times per day.  An MRI of the brain was performed on 06/15/2013.  This disc did come  for my review and I did review it.  There was significant movement artifact present.  There were a few scattered T2 hyperintensities.  Neuroimaging has previously been performed.  It is not available for my review today.    ALLERGIES:   Allergies  Allergen Reactions  . Prednisone Other (See Comments)    Elevated BP    CURRENT MEDICATIONS:  Outpatient Encounter Prescriptions as of 03/02/2015  Medication Sig  . albuterol (PROVENTIL) (2.5 MG/3ML) 0.083% nebulizer solution 2.5 mg.  . aspirin 81 MG tablet Take 81 mg by mouth daily.  Marland Kitchen atorvastatin (LIPITOR) 40 MG tablet Take 40 mg by mouth.  . Calcium Carbonate-Vitamin D (CALCIUM-VITAMIN D) 500-200 MG-UNIT per tablet Take 1 tablet by mouth.  .  carbidopa-levodopa (SINEMET IR) 25-100 MG per tablet Take 1.5 tablets by mouth 3 (three) times daily.   . cyclobenzaprine (FLEXERIL) 10 MG tablet Take 10 mg by mouth.  . flunisolide (NASALIDE) 25 MCG/ACT (0.025%) SOLN Place 2 sprays into the nose 2 (two) times daily.  Marland Kitchen glycopyrrolate (ROBINUL) 1 MG tablet Take 1 mg by mouth 2 (two) times daily.   Marland Kitchen lisinopril (PRINIVIL,ZESTRIL) 10 MG tablet Take 10 mg by mouth.  . Magnesium Hydroxide 2400 MG/10ML SUSP Take by mouth.  . metoprolol tartrate (LOPRESSOR) 25 MG tablet Take 25 mg by mouth daily.  . montelukast (SINGULAIR) 10 MG tablet Take 10 mg by mouth.  Marland Kitchen omeprazole (PRILOSEC) 20 MG capsule Take 20 mg by mouth.  . pramipexole (MIRAPEX) 0.5 MG tablet Take 1 tablet (0.5 mg total) by mouth 3 (three) times daily.  . [DISCONTINUED] pramipexole (MIRAPEX) 0.125 MG tablet 1 tablet three times per day for a week, then 2 tablets three times per day for a week and then fill the 0.5 mg tablet and take that   No facility-administered encounter medications on file as of 03/02/2015.    PAST MEDICAL HISTORY:   Past Medical History  Diagnosis Date  . Parkinson's disease   . Hypercholesteremia   . Hypertension   . GERD (gastroesophageal reflux disease)   . Asthma   . COPD (chronic obstructive pulmonary disease)   . Fibrosis of liver     PAST SURGICAL HISTORY:   Past Surgical History  Procedure Laterality Date  . Tonsillectomy    . Appendectomy    . Abdominal hysterectomy    . Leg surgery Bilateral     2 left 4 right  . Laparoscopy    . Wrist surgery Left     SOCIAL HISTORY:   History   Social History  . Marital Status: Legally Separated    Spouse Name: N/A  . Number of Children: N/A  . Years of Education: N/A   Occupational History  . Not on file.   Social History Main Topics  . Smoking status: Never Smoker   . Smokeless tobacco: Not on file  . Alcohol Use: No  . Drug Use: No  . Sexual Activity: Not on file   Other Topics Concern   . Not on file   Social History Narrative    FAMILY HISTORY:   Family Status  Relation Status Death Age  . Mother Alive     heart disease  . Father Deceased     PD  . Brother Deceased     Liver, lung, esophageal cancer  . Sister Alive     HTN  . Son Alive     healthy  . Daughter Alive     healthy  . Daughter Alive  healthy    ROS:  Having issues with peripheral vision off to the right for years.  ]A complete 10 system review of systems was obtained and was unremarkable apart from what is mentioned above.  PHYSICAL EXAMINATION:    VITALS:   Filed Vitals:   03/02/15 0921  BP: 120/76  Pulse: 64  Height: 5\' 4"  (1.626 m)  Weight: 180 lb (81.647 kg)    GEN:  The patient appears stated age and is in NAD. HEENT:  Normocephalic, atraumatic.  The mucous membranes are moist. The superficial temporal arteries are without ropiness or tenderness. CV:  RRR Lungs:  CTAB Neck/HEME:  There are no carotid bruits bilaterally.  Neurological examination:  Orientation: The patient is alert and oriented x3.  Cranial nerves: There is good facial symmetry.  There is just mild facial hypomimia There are no square wave jerks.  Extraocular muscles are intact. The visual fields are full to confrontational testing.  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  The speech is fluent and clear.  She is mildly hypophonic.  She has no difficulty with the guttural sounds.  Soft palate rises symmetrically and there is no tongue deviation. Hearing is intact to conversational tone. Sensation: Sensation is intact to light touch throughout Motor: Strength is 5/5 in the bilateral upper and lower extremities.   Shoulder shrug is equal and symmetric.  There is no pronator drift.   Movement examination: Tone: There is normal tone in the bilateral upper extremities.  The tone in the lower extremities is normal.  Abnormal  movements: No tremor identified today, even with distraction procedures (but states that she feels internal tremor off meds) Coordination:  There is slowness of RAM's but not necessarily physiologic and no decremation.  Seen bilaterally. Gait and Station: The patient makes 2 attempts to arise out of the chair and then easily arises.  She has an antalgic gait and attributes that to hip pain.  ASSESSMENT/PLAN:  1.  Parkinson's disease, by history  -Even off meds today, she still doesn't look parkinsonian.  She feels much better, however, on a combination of carbidopa/levodopa 25/100, 1.5 tablets at 3:30 AM/11:30 AM/5:30 PM and pramipexole, 0.5 mg tid.    -I had a long talk with her today.  Would like to do a DaT scan.  We will check insurance (dx tremor) and talk with her about that after.  -She is doing great with exercise and I congratulated her on that.   2.  Vision change  -She has seen ophthalmology, but vision testing was felt to be inconsistent and she was told to follow-up in the future for repeat testing.  Pt states that she was told she didn't need f/u for 1 year. 3.  Constipation  -A copy of the rancho recipe was given. 4.  REM behavior disorder  -Seems to be doing well right now and will just keep monitoring 5.  Follow-up with me will be in the next few months, sooner should new neurologic issues arise.  Much greater than 50% of this visit was spent in counseling with the patient and the family.  Total face to face time:  25 min

## 2015-03-02 NOTE — Patient Instructions (Signed)
1. If you would like to check on price of DAT scan through your insurance the CPT code for the procedure is [id number] with diagnosis code R25.1.

## 2015-03-07 ENCOUNTER — Encounter: Payer: Self-pay | Admitting: Neurology

## 2015-03-09 ENCOUNTER — Encounter: Payer: Self-pay | Admitting: Neurology

## 2015-03-09 ENCOUNTER — Telehealth: Payer: Self-pay | Admitting: Neurology

## 2015-03-09 NOTE — Telephone Encounter (Signed)
Spoke with [facility] to check on DAT scan appt. Patient has not returned their calls to schedule scan.

## 2015-04-15 ENCOUNTER — Encounter: Payer: Self-pay | Admitting: Physical Therapy

## 2015-04-15 NOTE — Therapy (Signed)
[facility] [address] [place], [place], [address] Phone: [phone]   Fax:  [phone]  Patient Details  Name: Sandra Strickland MRN: [id number] Date of Birth: 05-19-1946 Referring Provider:  No ref. provider found  Encounter Date: 04/15/2015  PHYSICAL THERAPY DISCHARGE SUMMARY  Visits from Start of Care: 1 (eval only)  Current functional level related to goals / functional outcomes: See eval; goals not to be able to be fully addressed due to patient no returning after eval   Remaining deficits: See eval   Education / Equipment: Unable due to pt not returning after eval  Plan: Patient agrees to discharge.  Patient goals were not met. Patient is being discharged due to not returning since the last visit.  ?????      Arn Mcomber W. 04/15/2015, 12:43 PM Frazier Butt., PT [facility] [address] [place], [place], [address] Phone: [phone]   Fax:  [phone]

## 2015-04-17 ENCOUNTER — Other Ambulatory Visit: Payer: Self-pay | Admitting: Neurology

## 2015-04-19 NOTE — Telephone Encounter (Signed)
Mirapex refill requested. Per last office note- patient to remain on medication. Refill approved and sent to patient's pharmacy.   

## 2015-05-04 ENCOUNTER — Telehealth: Payer: Self-pay | Admitting: Neurology

## 2015-05-04 NOTE — Telephone Encounter (Signed)
Made patient aware that I contact [facility] and they had tried to contact her several times with no call back. Patient given the number to call back and schedule DAT scan. [phone].

## 2015-05-04 NOTE — Telephone Encounter (Signed)
Pt called/ asked about the Gat Test/has it been set up yet?//call back @ [phone]

## 2015-05-25 ENCOUNTER — Encounter: Payer: Self-pay | Admitting: Neurology

## 2015-06-02 ENCOUNTER — Telehealth: Payer: Self-pay | Admitting: Neurology

## 2015-06-02 ENCOUNTER — Ambulatory Visit
Admission: RE | Admit: 2015-06-02 | Discharge: 2015-06-02 | Disposition: A | Discharge status: Home / Self Care | Payer: Medicare Other | Source: Ambulatory Visit | Attending: Neurology | Admitting: Neurology

## 2015-06-02 ENCOUNTER — Ambulatory Visit (INDEPENDENT_AMBULATORY_CARE_PROVIDER_SITE_OTHER): Payer: Medicare Other | Admitting: Neurology

## 2015-06-02 ENCOUNTER — Encounter: Payer: Self-pay | Admitting: Neurology

## 2015-06-02 VITALS — BP 118/70 | HR 75 | Ht 64.0 in | Wt 186.0 lb

## 2015-06-02 DIAGNOSIS — W19XXXA Unspecified fall, initial encounter: Secondary | ICD-10-CM

## 2015-06-02 DIAGNOSIS — G20 Parkinson's disease: Secondary | ICD-10-CM | POA: Diagnosis not present

## 2015-06-02 DIAGNOSIS — R270 Ataxia, unspecified: Secondary | ICD-10-CM

## 2015-06-02 NOTE — Patient Instructions (Signed)
1. CT head today.

## 2015-06-02 NOTE — Progress Notes (Signed)
Sandra Strickland was seen today in the movement disorders clinic for neurologic consultation at the request of Dr. Effie Shy (she goes to the doctor in [facility] because she is native [place] and it was free and she had no insurance for a while).  The patient has previously seen Dr. Craige Cotta, but those notes are not available.  The consultation is for the evaluation of Parkinson's disease.  The first symptom(s) the patient noticed was shuffling gait and this was 3 years ago and her mother told her to go to the doctor as her father had PD and had similar sx's.  She was then referred to Dr. Craige Cotta.  She had and MRI brain and she cannot remember the results but remembers it wasn't completely normal.  She was dx with PD.  She was placed on a new drug (? Azilect) and she had no insurance and so she was then placed on carbidopa/levodopa 25/100 and she started at 1 po tid and it helped; about 7 months ago, Dr. Craige Cotta increased her medication to 1.5 tid and it helped but she is having more tremor and more shuffling again.  Her biggest c/o is spasms of the hands and cramping of the feet, more at night but also during the daytime.  She will try mustard and vinegar to try and help.  03/02/15 update:  The patient is following up today.  Last visit, I added pramipexole, 0.5 mg 3 times a day because of significant cramping.  She also takes carbidopa/levodopa 25/100, 1.5 tablet at 3:30 AM, 1.5 tablet at 11:30 AM and 1.5 tablet at 5:30 PM.  I want her to hold her medication for today's visit as last visit she did not look particularly parkinsonian and wanted to see how she looked today off of medication.  She has been off since 5 pm yesterday.  Since our last visit, she tried to attend therapy at the neuro-rehabilitation center but states that they couldn't do it all in one day and couldn't accomodate her work schedule.  She bought a stationary bike and a punching bag and boxing and feels so much better.  She is swimming for exercise  as well and her husband has noted a big difference in her physicially.  She feels much better.  She is averaging 12,000 steps a day.  The patient did see Dr. Nelle Don at [facility] ophthalmology because of vision change.  He did state that the patient had evidence of early cataracts with a history of stroke and perhaps some visual field loss but he needed to repeat the visual field testing in the future because she did not do well with initial testing.  There were a lot of false negatives.  In the right eye, there appeared to be a nasal defect but the left eye was nonspecific; there did appear to be visual field loss but it was nondiagnostic.  I was able to get records from her prior neurologist at [facility] since last visit.  It looks like they first saw the patient on 06/10/2013 and there was evidence of cogwheel rigidity on the left and it was suggested that the patient start Mirapex.  However, it turns out that this was too expensive for the patient at the time because she did not have insurance.  She was therefore started on levodopa, which did help somewhat.  This was increased in September, 2013-08-13/2 tablets 3 times per day.  An MRI of the brain was performed on 06/15/2013.  This disc did come  for my review and I did review it.  There was significant movement artifact present.  There were a few scattered T2 hyperintensities.  06/02/15 update:  The patient returns today for follow-up.  She remains on carbidopa/levodopa 25/100, 1-1/2 tablets 3 times per day in addition to pramipexole, 0.5 mg 3 times per day.  Last visit, she came to the office off medication and did not look particularly parkinsonian to me.  I have begun to question the diagnosis, but because she has been on the medication for so long and seen a few neurologists, she and I decided to order a Dat scan last visit.  She did not end up going initially but states that she went yesterday.  Results are not yet available.  Several months ago,  she states that she fell.  She was outside and was washing her car and slipped and fell.  Ended up going to ER (but several months later) because was having trouble walking and had x-rays.  Progressively, she began to have more trouble with walking and with exercise.  She saw her ortho 2 weeks ago.  She had an injection in the hips and knees and had knew x-rays and was told that she had arthritis.  She plans to have an MRI of the lumbar spine and hips in the near future.Thinks some word finding trouble since then as well.  Not able to exercise nearly as muchy  Neuroimaging has previously been performed.  It is not available for my review today.    ALLERGIES:   Allergies  Allergen Reactions  . Prednisone Other (See Comments)    Elevated BP    CURRENT MEDICATIONS:  Outpatient Encounter Prescriptions as of 06/02/2015  Medication Sig  . albuterol (PROVENTIL) (2.5 MG/3ML) 0.083% nebulizer solution 2.5 mg.  . aspirin 81 MG tablet Take 81 mg by mouth daily.  Marland Kitchen atorvastatin (LIPITOR) 40 MG tablet Take 40 mg by mouth.  . Calcium Carbonate-Vitamin D (CALCIUM-VITAMIN D) 500-200 MG-UNIT per tablet Take 1 tablet by mouth.  . carbidopa-levodopa (SINEMET IR) 25-100 MG per tablet Take 1.5 tablets by mouth 3 (three) times daily.   . cyclobenzaprine (FLEXERIL) 10 MG tablet Take 10 mg by mouth.  . flunisolide (NASALIDE) 25 MCG/ACT (0.025%) SOLN Place 2 sprays into the nose 2 (two) times daily.  Marland Kitchen gabapentin (NEURONTIN) 600 MG tablet Take 600 mg by mouth at bedtime.   Marland Kitchen glycopyrrolate (ROBINUL) 1 MG tablet Take 1 mg by mouth 2 (two) times daily.   Marland Kitchen lisinopril (PRINIVIL,ZESTRIL) 10 MG tablet Take 10 mg by mouth.  . Magnesium Hydroxide 2400 MG/10ML SUSP Take by mouth.  . metoprolol tartrate (LOPRESSOR) 25 MG tablet Take 25 mg by mouth daily.  . montelukast (SINGULAIR) 10 MG tablet Take 10 mg by mouth.  Marland Kitchen omeprazole (PRILOSEC) 20 MG capsule Take 20 mg by mouth.  . polyethylene glycol powder  (GLYCOLAX/MIRALAX) powder as needed.   . pramipexole (MIRAPEX) 0.5 MG tablet TAKE ONE TABLET BY MOUTH THREE TIMES DAILY   No facility-administered encounter medications on file as of 06/02/2015.    PAST MEDICAL HISTORY:   Past Medical History  Diagnosis Date  . Parkinson's disease   . Hypercholesteremia   . Hypertension   . GERD (gastroesophageal reflux disease)   . Asthma   . COPD (chronic obstructive pulmonary disease)   . Fibrosis of liver     PAST SURGICAL HISTORY:   Past Surgical History  Procedure Laterality Date  . Tonsillectomy    . Appendectomy    .  Abdominal hysterectomy    . Leg surgery Bilateral     2 left 4 right  . Laparoscopy    . Wrist surgery Left     SOCIAL HISTORY:   Social History   Social History  . Marital Status: Legally Separated    Spouse Name: N/A  . Number of Children: N/A  . Years of Education: N/A   Occupational History  . Not on file.   Social History Main Topics  . Smoking status: Never Smoker   . Smokeless tobacco: Not on file  . Alcohol Use: No  . Drug Use: No  . Sexual Activity: Not on file   Other Topics Concern  . Not on file   Social History Narrative    FAMILY HISTORY:   Family Status  Relation Status Death Age  . Mother Alive     heart disease  . Father Deceased     PD  . Brother Deceased     Liver, lung, esophageal cancer  . Sister Alive     HTN  . Son Alive     healthy  . Daughter Alive     healthy  . Daughter Alive     healthy    ROS:  Having issues with peripheral vision off to the right for years.  ]A complete 10 system review of systems was obtained and was unremarkable apart from what is mentioned above.  PHYSICAL EXAMINATION:    VITALS:   Filed Vitals:   06/02/15 1039  BP: 118/70  Pulse: 75  Height: 5\' 4"  (1.626 m)  Weight: 186 lb (84.369 kg)    GEN:  The patient appears stated age and is in NAD.  Talks expressively with her hands today bilaterally HEENT:  Normocephalic, atraumatic.   The mucous membranes are moist. The superficial temporal arteries are without ropiness or tenderness. CV:  RRR Lungs:  CTAB Neck/HEME:  There are no carotid bruits bilaterally.  Neurological examination:  Orientation: The patient is alert and oriented x3.  Cranial nerves: There is good facial symmetry.  There is just mild facial hypomimia There are no square wave jerks.  Extraocular muscles are intact. The visual fields are full to confrontational testing.  The speech is fluent and clear.    She has no difficulty with the guttural sounds.  Soft palate rises symmetrically and there is no tongue deviation. Hearing is intact to conversational tone. Sensation: Sensation is intact to light touch throughout Motor: Strength is 5/5 in the bilateral upper and lower extremities.   Shoulder shrug is equal and symmetric.  There is no pronator drift.   Movement examination: Tone: There is normal tone in the bilateral upper extremities.  The tone in the lower extremities is normal.  Abnormal movements: No tremor identified today, even with distraction procedures (but states that she feels internal tremor off meds) Coordination:  There is perhaps just slight finger taps on the L but all other RAMs were good. Gait and Station:   She has an antalgic gait and attributes that to hip pain.  ASSESSMENT/PLAN:  1.  Parkinson's disease, by history  -Even off meds last visit, she still didn't look parkinsonian and doesn't today either.  She feels much better, however, on a combination of carbidopa/levodopa 25/100, 1.5 tablets at 3:30 AM/11:30 AM/5:30 PM and pramipexole, 0.5 mg tid.    -Feels more ataxic since fell few months ago and more word finding trouble.  Will do CT brain.  Undergoing ortho eval for hip/back issues  -awaiting  DaT scan results; had it done yesterday. 2.  Vision change  -She has gone and told she had start of cataracts but no surgery.  Told she needs reading glasses.  Doing better in this  regard 3.  Constipation  -A copy of the rancho recipe was given. 4.  REM behavior disorder  -Seems to be doing well right now and will just keep monitoring 5.  Follow-up with me will be in the next few months, sooner should new neurologic issues arise.  Much greater than 50% of this visit was spent in counseling with the patient and the family.  Total face to face time:  25 min

## 2015-06-02 NOTE — Telephone Encounter (Signed)
Patient made aware CT normal.

## 2015-06-02 NOTE — Telephone Encounter (Signed)
Please let pt know that her DaT scan is normal.  I'm happy to talk with her further if she wants to make another appt but given my examination with her last visit off medication and given this test, I am really calling into question if her previous diagnosis of PD is accurate and think that perhaps it is not.  This is great news!!!  Again, happy to talk with her in person if she wants.

## 2015-06-02 NOTE — Telephone Encounter (Signed)
-----   Message from Octaviano Battyebecca S Tat, DO sent at 06/02/2015 12:56 PM EDT ----- Let pt know that her CT looked ok

## 2015-06-03 NOTE — Telephone Encounter (Signed)
Patient made aware of results. She had many questions and states she will definitely call back to set up an appt to discuss.

## 2015-09-02 ENCOUNTER — Ambulatory Visit: Payer: PRIVATE HEALTH INSURANCE | Admitting: Neurology

## 2015-10-18 ENCOUNTER — Ambulatory Visit: Payer: PRIVATE HEALTH INSURANCE | Admitting: Neurology

## 2015-10-18 DIAGNOSIS — Z029 Encounter for administrative examinations, unspecified: Secondary | ICD-10-CM

## 2016-05-09 IMAGING — CT CT HEAD W/O CM
2 series · 16 of 30 positions shown, 18 images · non-contrast
Comparison: None.

CLINICAL DATA: Fall 2 months ago. Head trauma. Persistent occipital
headaches, dizziness, and blurred vision. Ataxia. Initial encounter.

EXAM:
CT HEAD WITHOUT CONTRAST
TECHNIQUE: Contiguous axial images were obtained from the base of the skull
through the vertex without intravenous contrast.

[Series 3: head bone · axial · 0.49mm/px · z∈[+33,+148]mm · 8 of 56 slices shown]
[im 6/56  bone]
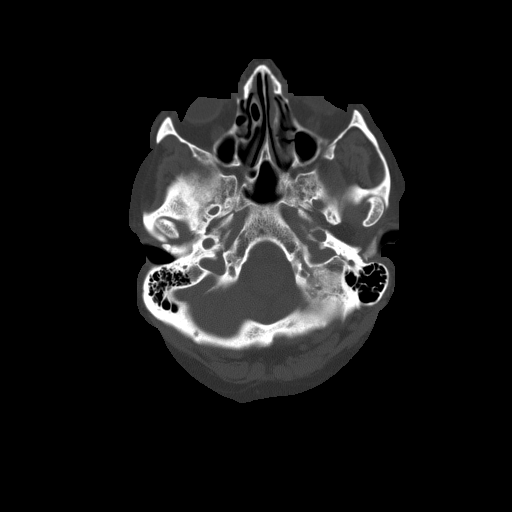
[im 12/56  bone]
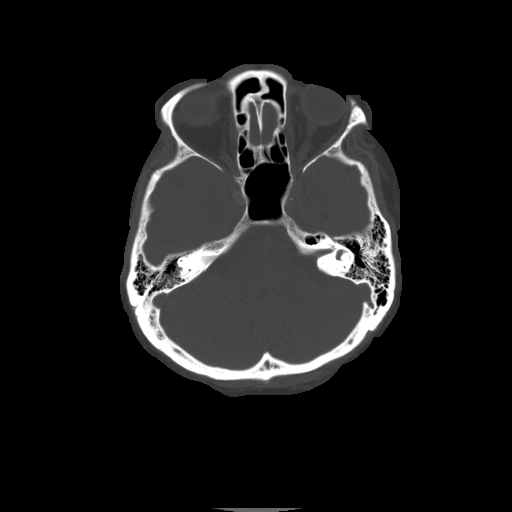
[im 18/56  bone]
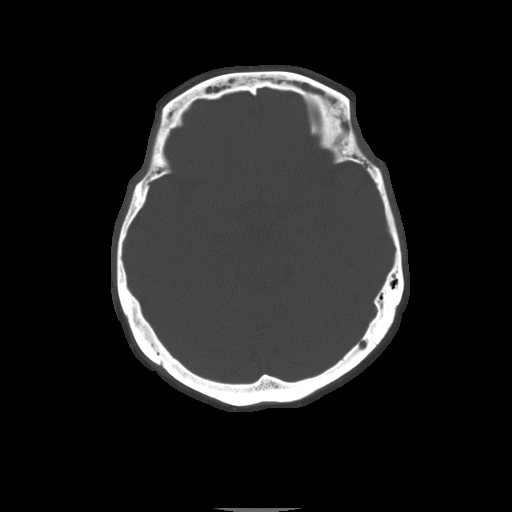
[im 24/56  bone]
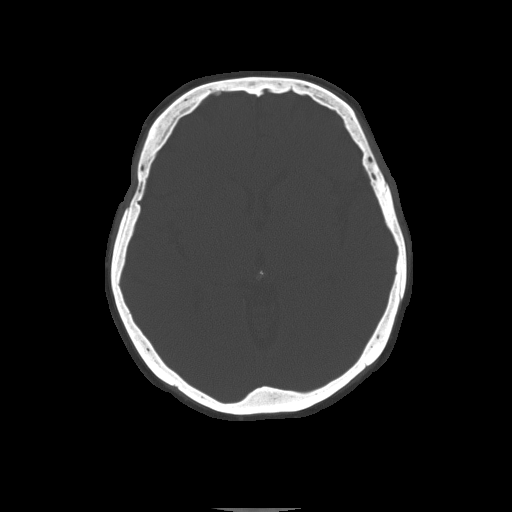
[im 32/56  bone]
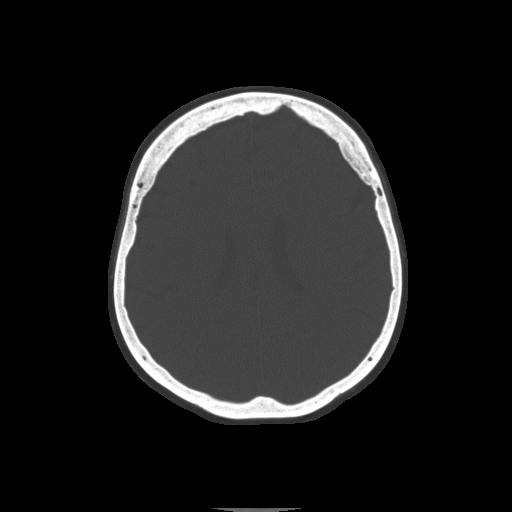
[im 38/56  bone]
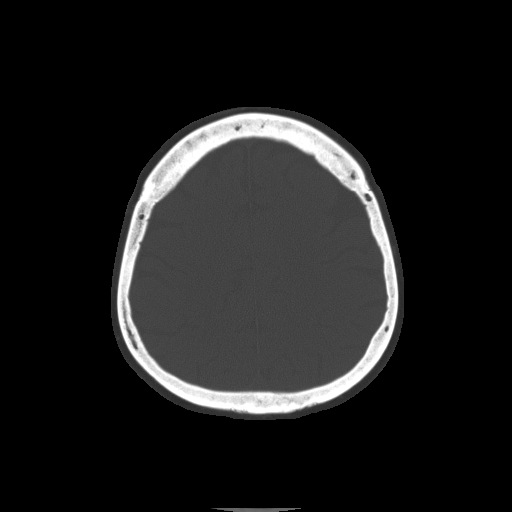
[im 44/56  bone]
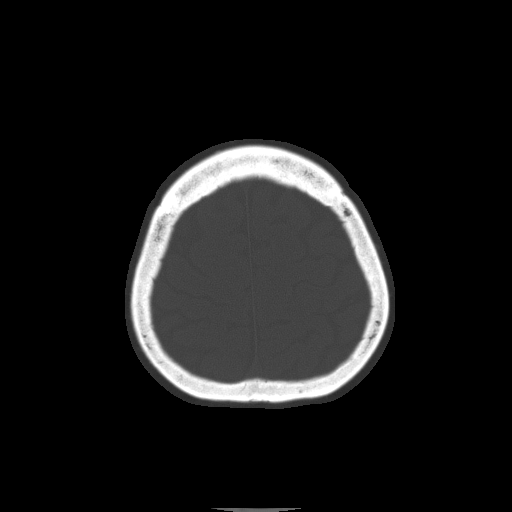
[im 50/56  bone]
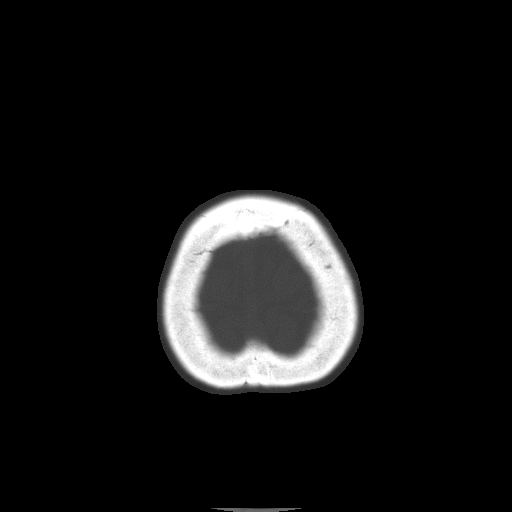

[Series 32: 3d filtered head w/o · axial · non-contrast · 0.49mm/px · z∈[+37,+147]mm · 8 of 28 slices shown, 10 images]
[im 4/28  brain]
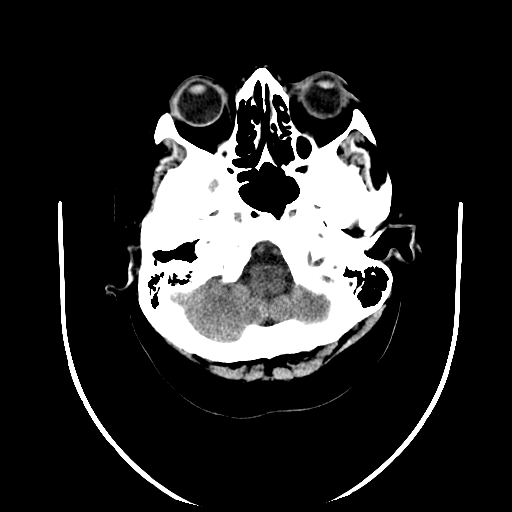
[im 4/28  bone]
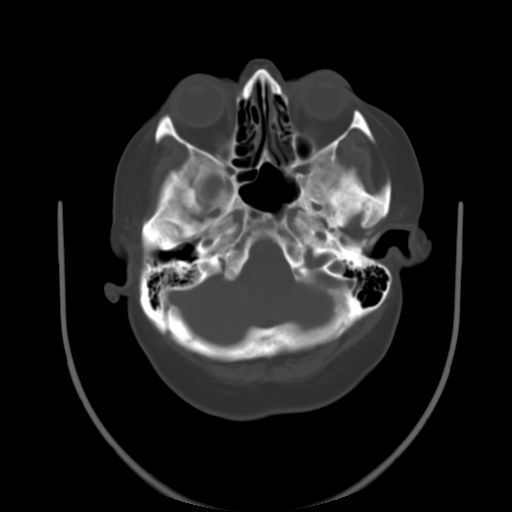
[im 7/28  brain]
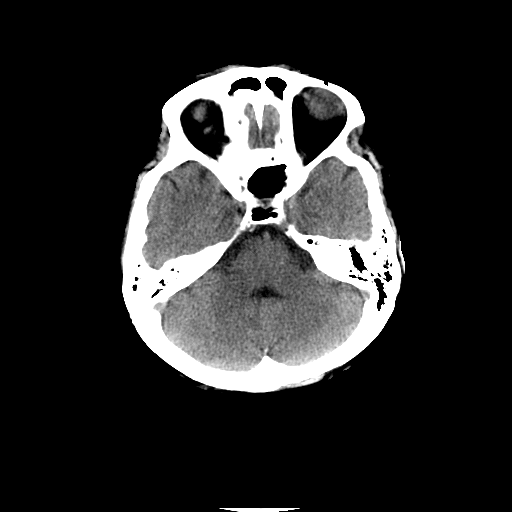
[im 10/28  brain]
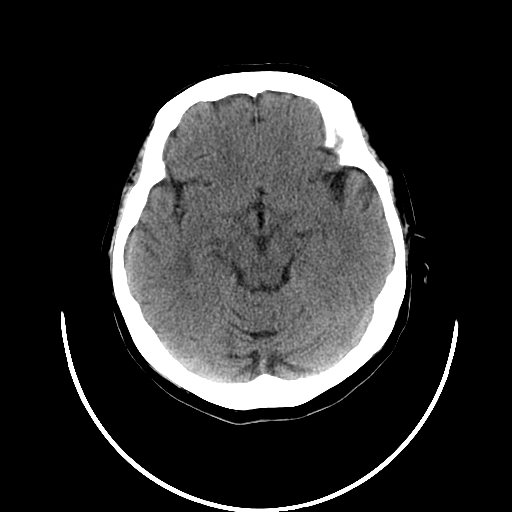
[im 13/28  brain]
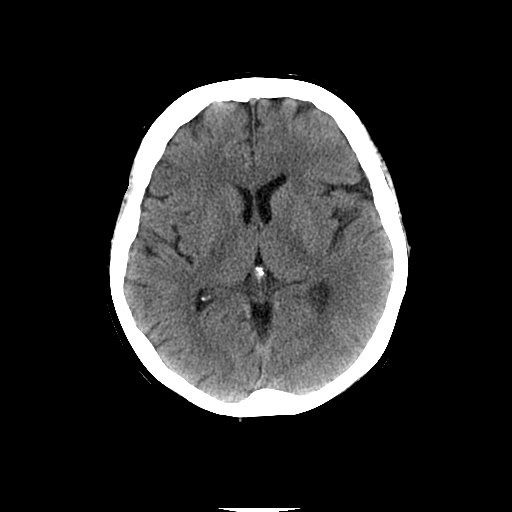
[im 16/28  brain]
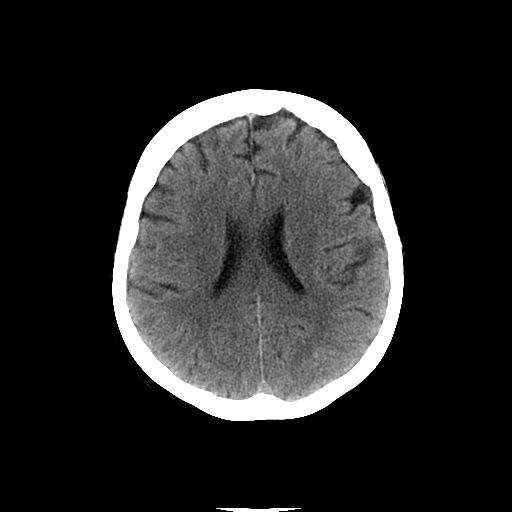
[im 16/28  bone]
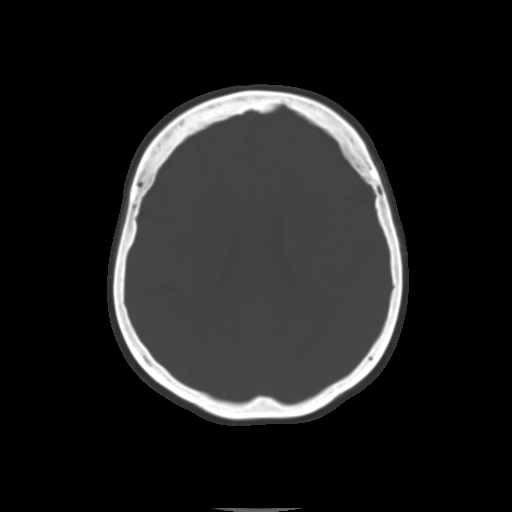
[im 19/28  brain]
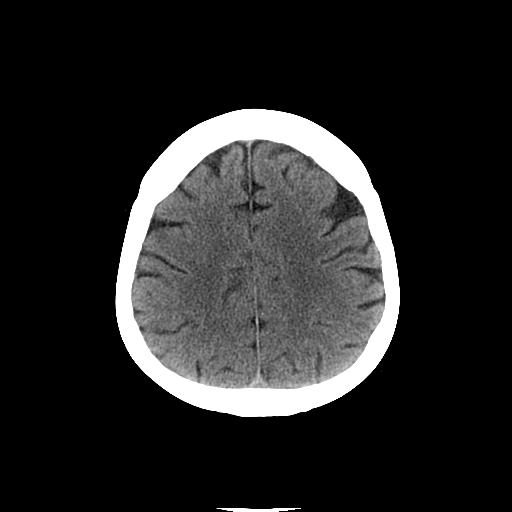
[im 22/28  brain]
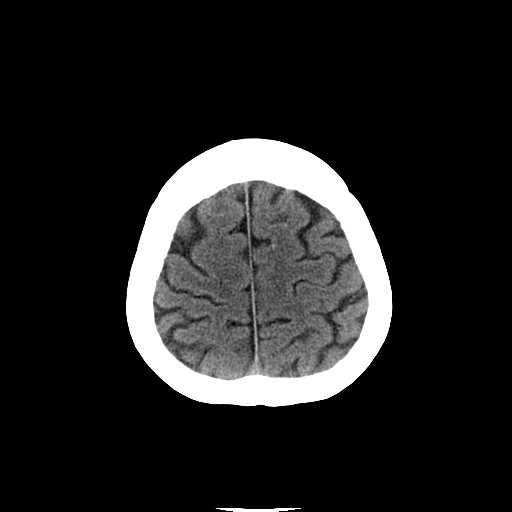
[im 25/28  brain]
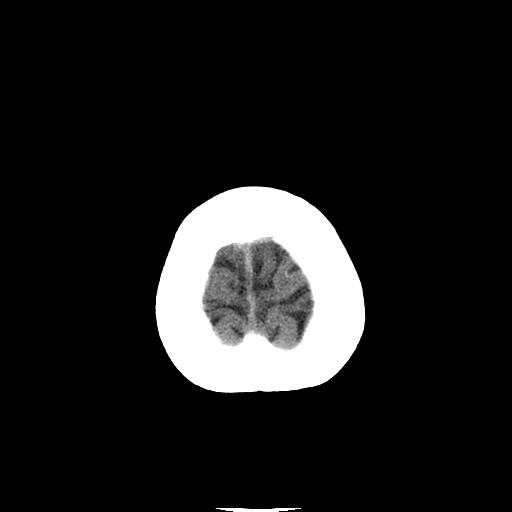

[16 of 30 positions shown; findings below may reference images not displayed]

FINDINGS: Brain: No evidence of acute infarction, hemorrhage, extra-axial
collection, ventriculomegaly, or mass effect.

Vascular: No hyperdense vessel or unexpected calcification.

Skull: Negative for fracture or focal lesion.

Sinuses/Orbits: No acute findings.

Other: None.
IMPRESSION: Negative noncontrast head CT.
